# Patient Record
Sex: Female | Born: 1965 | Race: White | Hispanic: No | Marital: Married | Smoking: Never smoker
Health system: Southern US, Community
[De-identification: ages and names within clinical notes are randomized; demographics above are authoritative.]

## PROBLEM LIST (undated history)

## (undated) DIAGNOSIS — I2510 Atherosclerotic heart disease of native coronary artery without angina pectoris: Secondary | ICD-10-CM

## (undated) DIAGNOSIS — E7800 Pure hypercholesterolemia, unspecified: Secondary | ICD-10-CM

---

## 1997-12-17 ENCOUNTER — Encounter: Admission: RE | Admit: 1997-12-17 | Discharge: 1998-03-17 | Payer: Self-pay | Admitting: Obstetrics & Gynecology

## 1998-05-19 ENCOUNTER — Inpatient Hospital Stay (HOSPITAL_COMMUNITY): Admission: AD | Admit: 1998-05-19 | Discharge: 1998-05-19 | Payer: Self-pay | Admitting: Obstetrics and Gynecology

## 1998-07-01 ENCOUNTER — Inpatient Hospital Stay (HOSPITAL_COMMUNITY): Admission: AD | Admit: 1998-07-01 | Discharge: 1998-07-03 | Payer: Self-pay | Admitting: Obstetrics and Gynecology

## 1998-08-13 ENCOUNTER — Other Ambulatory Visit: Admission: RE | Admit: 1998-08-13 | Discharge: 1998-08-13 | Payer: Self-pay | Admitting: Obstetrics and Gynecology

## 1999-09-27 ENCOUNTER — Other Ambulatory Visit: Admission: RE | Admit: 1999-09-27 | Discharge: 1999-09-27 | Payer: Self-pay | Admitting: Obstetrics and Gynecology

## 2000-12-19 ENCOUNTER — Other Ambulatory Visit: Admission: RE | Admit: 2000-12-19 | Discharge: 2000-12-19 | Payer: Self-pay | Admitting: Obstetrics and Gynecology

## 2002-01-28 ENCOUNTER — Other Ambulatory Visit: Admission: RE | Admit: 2002-01-28 | Discharge: 2002-01-28 | Payer: Self-pay | Admitting: Obstetrics and Gynecology

## 2003-03-19 ENCOUNTER — Other Ambulatory Visit: Admission: RE | Admit: 2003-03-19 | Discharge: 2003-03-19 | Payer: Self-pay | Admitting: Obstetrics and Gynecology

## 2004-03-29 ENCOUNTER — Other Ambulatory Visit: Admission: RE | Admit: 2004-03-29 | Discharge: 2004-03-29 | Payer: Self-pay | Admitting: Obstetrics and Gynecology

## 2005-05-08 ENCOUNTER — Other Ambulatory Visit: Admission: RE | Admit: 2005-05-08 | Discharge: 2005-05-08 | Payer: Self-pay | Admitting: Obstetrics and Gynecology

## 2007-09-02 ENCOUNTER — Ambulatory Visit: Payer: Self-pay | Admitting: Gastroenterology

## 2007-09-02 DIAGNOSIS — K5900 Constipation, unspecified: Secondary | ICD-10-CM | POA: Insufficient documentation

## 2007-09-02 DIAGNOSIS — R159 Full incontinence of feces: Secondary | ICD-10-CM | POA: Insufficient documentation

## 2007-09-02 DIAGNOSIS — Z862 Personal history of diseases of the blood and blood-forming organs and certain disorders involving the immune mechanism: Secondary | ICD-10-CM

## 2007-09-02 LAB — CONVERTED CEMR LAB
Basophils Relative: 0 % (ref 0.0–3.0)
Eosinophils Relative: 2.4 % (ref 0.0–5.0)
HCT: 37.9 % (ref 36.0–46.0)
Hemoglobin: 13 g/dL (ref 12.0–15.0)
Lymphocytes Relative: 36.4 % (ref 12.0–46.0)
Monocytes Absolute: 0.6 10*3/uL (ref 0.1–1.0)
Monocytes Relative: 7.4 % (ref 3.0–12.0)
Neutro Abs: 4.1 10*3/uL (ref 1.4–7.7)
RBC: 4.15 M/uL (ref 3.87–5.11)

## 2007-09-19 ENCOUNTER — Ambulatory Visit: Payer: Self-pay | Admitting: Gastroenterology

## 2014-11-11 ENCOUNTER — Encounter: Payer: Self-pay | Admitting: Gastroenterology

## 2017-04-01 ENCOUNTER — Emergency Department
Admission: EM | Admit: 2017-04-01 | Discharge: 2017-04-01 | Disposition: A | Discharge status: Home / Self Care | Payer: BLUE CROSS/BLUE SHIELD | Source: Home / Self Care

## 2017-04-01 ENCOUNTER — Encounter: Payer: Self-pay | Admitting: Emergency Medicine

## 2017-04-01 DIAGNOSIS — J111 Influenza due to unidentified influenza virus with other respiratory manifestations: Secondary | ICD-10-CM | POA: Diagnosis not present

## 2017-04-01 HISTORY — DX: Pure hypercholesterolemia, unspecified: E78.00

## 2017-04-01 HISTORY — DX: Atherosclerotic heart disease of native coronary artery without angina pectoris: I25.10

## 2017-04-01 LAB — POCT INFLUENZA A/B
INFLUENZA A, POC: POSITIVE — AB
INFLUENZA B, POC: NEGATIVE

## 2017-04-01 MED ORDER — BENZONATATE 100 MG PO CAPS: 100.0000 mg | ORAL_CAPSULE | Freq: Three times a day (TID) | ORAL | 0 refills | Status: AC

## 2017-04-01 MED ORDER — OSELTAMIVIR PHOSPHATE 75 MG PO CAPS: 75.0000 mg | ORAL_CAPSULE | Freq: Two times a day (BID) | ORAL | 0 refills | Status: AC

## 2017-04-01 NOTE — ED Triage Notes (Signed)
[facility] wit C/O Flu like symptoms times 2 days fever 102.3 last PM

## 2017-04-01 NOTE — Discharge Instructions (Signed)
Return if any problems.

## 2017-04-03 NOTE — ED Provider Notes (Signed)
[facility]    CSN: [id number] Arrival date & time: 04/01/17  1455     History   Chief Complaint Chief Complaint  Patient presents with  . Influenza    HPI Bailey Watts is a 52 y.o. female.   The history is provided by the patient. No language interpreter was used.  Influenza  Presenting symptoms: cough, fever and myalgias   Severity:  Moderate Onset quality:  Gradual Duration:  2 days Progression:  Worsening Chronicity:  New Relieved by:  Nothing Worsened by:  Nothing Ineffective treatments:  None tried Associated symptoms: no chills   Risk factors: no diabetes problem     Past Medical History:  Diagnosis Date  . Coronary artery disease   . High cholesterol     Patient Active Problem List   Diagnosis Date Noted  . CONSTIPATION 09/02/2007  . FECAL INCONTINENCE 09/02/2007  . ANEMIA, IRON DEFICIENCY, HX OF 09/02/2007    History reviewed. No pertinent surgical history.  OB History    No data available       Home Medications    Prior to Admission medications   Medication Sig Start Date End Date Taking? Authorizing Provider  aspirin EC 81 MG tablet Take 81 mg by mouth daily.   Yes [provider]  FLUoxetine (PROZAC) 20 MG/5ML solution Take by mouth daily.   Yes [provider]  loratadine (CLARITIN) 10 MG tablet Take 10 mg by mouth daily.   Yes [provider]  benzonatate (TESSALON) 100 MG capsule Take 1 capsule (100 mg total) by mouth every 8 (eight) hours. 04/01/17   Elson Areas, PA-C  oseltamivir (TAMIFLU) 75 MG capsule Take 1 capsule (75 mg total) by mouth every 12 (twelve) hours. 04/01/17   Elson Areas, PA-C    Family History History reviewed. No pertinent family history.  Social History Social History   Tobacco Use  . Smoking status: Never Smoker  . Smokeless tobacco: Never Used  Substance Use Topics  . Alcohol use: No    Frequency: Never  . Drug use: No     Allergies   Patient has  no allergy information on record.   Review of Systems Review of Systems  Constitutional: Positive for fever. Negative for chills.  Respiratory: Positive for cough.   Musculoskeletal: Positive for myalgias.  All other systems reviewed and are negative.    Physical Exam Triage Vital Signs ED Triage Vitals  Enc Vitals Group     BP 04/01/17 1548 117/76     Pulse Rate 04/01/17 1548 (!) 114     Resp 04/01/17 1548 16     Temp 04/01/17 1548 100.2 F (37.9 C)     Temp Source 04/01/17 1548 Oral     SpO2 04/01/17 1548 97 %     Weight 04/01/17 1550 148 lb 8 oz (67.4 kg)     Height 04/01/17 1550 5\' 5"  (1.651 m)     Head Circumference --      Peak Flow --      Pain Score 04/01/17 1549 3     Pain Loc --      Pain Edu? --      Excl. in GC? --    No data found.  Updated Vital Signs BP 117/76 (BP Location: Right Arm)   Pulse (!) 114   Temp 100.2 F (37.9 C) (Oral)   Resp 16   Ht 5\' 5"  (1.651 m)   Wt 148 lb 8 oz (67.4 kg)  SpO2 97%   BMI 24.71 kg/m   Visual Acuity Right Eye Distance:   Left Eye Distance:   Bilateral Distance:    Right Eye Near:   Left Eye Near:    Bilateral Near:     Physical Exam  Constitutional: She appears well-developed and well-nourished. No distress.  HENT:  Head: Normocephalic and atraumatic.  Right Ear: External ear normal.  Left Ear: External ear normal.  Nose: Nose normal.  Mouth/Throat: Oropharynx is clear and moist.  Eyes: Conjunctivae are normal.  Neck: Neck supple.  Cardiovascular: Normal rate and regular rhythm.  No murmur heard. Pulmonary/Chest: Effort normal and breath sounds normal. No respiratory distress.  Abdominal: Soft. There is no tenderness.  Musculoskeletal: She exhibits no edema.  Neurological: She is alert.  Skin: Skin is warm and dry.  Psychiatric: She has a normal mood and affect.  Nursing note and vitals reviewed.    UC Treatments / Results  Labs (all labs ordered are listed, but only abnormal results are  displayed) Labs Reviewed  POCT INFLUENZA A/B - Abnormal; Notable for the following components:      Result Value   Influenza A, POC Positive (*)    All other components within normal limits    EKG  EKG Interpretation None       Radiology No results found.  Procedures Procedures (including critical care time)  Medications Ordered in UC Medications - No data to display   Initial Impression / Assessment and Plan / UC Course  I have reviewed the triage vital signs and the nursing notes.  Pertinent labs & imaging results that were available during my care of the patient were reviewed by me and considered in my medical decision making (see chart for details).     MDM:  Pt counseled on influenza.  Pt given rx for tamiflu and tessalon.    Final Clinical Impressions(s) / UC Diagnoses   Final diagnoses:  Influenza    ED Discharge Orders        Ordered    oseltamivir (TAMIFLU) 75 MG capsule  Every 12 hours     04/01/17 1613    benzonatate (TESSALON) 100 MG capsule  Every 8 hours     04/01/17 1615     An After Visit Summary was printed and given to the patient.   Controlled Substance Prescriptions [place] Controlled Substance Registry consulted? Not Applicable   Elson AreasSofia, Tahnee Cifuentes K, [place]-C 04/03/17 1724

## 2017-04-15 ENCOUNTER — Encounter: Payer: Self-pay | Admitting: Emergency Medicine

## 2017-04-15 ENCOUNTER — Emergency Department (INDEPENDENT_AMBULATORY_CARE_PROVIDER_SITE_OTHER): Payer: BLUE CROSS/BLUE SHIELD

## 2017-04-15 ENCOUNTER — Emergency Department
Admission: EM | Admit: 2017-04-15 | Discharge: 2017-04-15 | Disposition: A | Discharge status: Home / Self Care | Payer: BLUE CROSS/BLUE SHIELD | Source: Home / Self Care | Attending: Emergency Medicine | Admitting: Emergency Medicine

## 2017-04-15 DIAGNOSIS — R0781 Pleurodynia: Secondary | ICD-10-CM

## 2017-04-15 DIAGNOSIS — R0789 Other chest pain: Secondary | ICD-10-CM | POA: Diagnosis not present

## 2017-04-15 DIAGNOSIS — R52 Pain, unspecified: Secondary | ICD-10-CM

## 2017-04-15 NOTE — ED Provider Notes (Signed)
[facility]    CSN: [id number] Arrival date & time: 04/15/17  1136     History   Chief Complaint Chief Complaint  Patient presents with  . Abdominal Pain    HPI Bailey Watts is a 52 y.o. female.   HPI Patient was seen 2 weeks ago and diagnosed with flu this gradually improved except for a lingering persistent cough. She enters today wth a four-day history of right-sided chest pain which she says is stabbing in nature and primarily located in the upper abdomen. She has had no GI symptoms of nausea or vomiting. She has a family history of gallbladder disease. Past Medical History:  Diagnosis Date  . Coronary artery disease   . High cholesterol     Patient Active Problem List   Diagnosis Date Noted  . CONSTIPATION 09/02/2007  . FECAL INCONTINENCE 09/02/2007  . ANEMIA, IRON DEFICIENCY, HX OF 09/02/2007    History reviewed. No pertinent surgical history.  OB History    No data available       Home Medications    Prior to Admission medications   Medication Sig Start Date End Date Taking? Authorizing Provider  aspirin EC 81 MG tablet Take 81 mg by mouth daily.    [provider]  benzonatate (TESSALON) 100 MG capsule Take 1 capsule (100 mg total) by mouth every 8 (eight) hours. 04/01/17   Elson Areas, PA-C  FLUoxetine (PROZAC) 20 MG/5ML solution Take by mouth daily.    [provider]  loratadine (CLARITIN) 10 MG tablet Take 10 mg by mouth daily.    [provider]  oseltamivir (TAMIFLU) 75 MG capsule Take 1 capsule (75 mg total) by mouth every 12 (twelve) hours. 04/01/17   Elson Areas, PA-C    Family History History reviewed. No pertinent family history.  Social History Social History   Tobacco Use  . Smoking status: Never Smoker  . Smokeless tobacco: Never Used  Substance Use Topics  . Alcohol use: No    Frequency: Never  . Drug use: No     Allergies   Patient has no allergy information on  record.   Review of Systems Review of Systems  Constitutional: Negative for chills and fever.  HENT: Negative.   Eyes: Negative.   Respiratory:       She is not short of breath but does have pain with taking a deep breath.  Cardiovascular: Positive for chest pain.  Gastrointestinal: Positive for abdominal pain. Negative for nausea.  Endocrine: Negative.   Genitourinary: Negative.      Physical Exam Triage Vital Signs ED Triage Vitals  Enc Vitals Group     BP 04/15/17 1224 111/74     Pulse Rate 04/15/17 1224 76     Resp 04/15/17 1224 16     Temp 04/15/17 1224 97.9 F (36.6 C)     Temp Source 04/15/17 1224 Oral     SpO2 04/15/17 1224 98 %     Weight 04/15/17 1225 150 lb 12 oz (68.4 kg)     Height 04/15/17 1225 5\' 7"  (1.702 m)     Head Circumference --      Peak Flow --      Pain Score 04/15/17 1225 4     Pain Loc --      Pain Edu? --      Excl. in GC? --    No data found.  Updated Vital Signs BP 111/74 (BP Location: Right Arm)   Pulse 76  Temp 97.9 F (36.6 C) (Oral)   Resp 16   Ht 5\' 7"  (1.702 m)   Wt 150 lb 12 oz (68.4 kg)   SpO2 98%   BMI 23.61 kg/m   Visual Acuity Right Eye Distance:   Left Eye Distance:   Bilateral Distance:    Right Eye Near:   Left Eye Near:    Bilateral Near:     Physical Exam   UC Treatments / Results  Labs (all labs ordered are listed, but only abnormal results are displayed) Labs Reviewed - No data to display  EKG  EKG Interpretation None       Radiology Dg Chest 2 View  Result Date: 04/15/2017 CLINICAL DATA:  Right pleuritic chest pain EXAM: CHEST - 2 VIEW COMPARISON:  Right rib series performed today FINDINGS: Heart and mediastinal contours are within normal limits. No focal opacities or effusions. No acute bony abnormality. No pneumothorax. IMPRESSION: No active cardiopulmonary disease. Electronically Signed   By: Charlett NoseKevin  Dover M.D.   On: 04/15/2017 13:36   Dg Ribs Unilateral Right  Result Date:  04/15/2017 CLINICAL DATA:  Right rib pain EXAM: RIGHT RIBS - 2 VIEW COMPARISON:  Chest x-ray performed today FINDINGS: No fracture or other bone lesions are seen involving the ribs. IMPRESSION: Negative. Electronically Signed   By: Charlett NoseKevin  Dover M.D.   On: 04/15/2017 13:36    Procedures Procedures (including critical care time)  Medications Ordered in UC Medications - No data to display   Initial Impression / Assessment and Plan / UC Course  I have reviewed the triage vital signs and the nursing notes.  Pertinent labs & imaging results that were available during my care of the patient were reviewed by me and considered in my medical decision making (see chart for details). Chest x-ray and right rib films are unremarkable. Exam is consistent with chest wall pain. She will be on ibuprofen. She will also watch for development of a rash in the area.      Final Clinical Impressions(s) / UC Diagnoses   Final diagnoses:  Chest wall pain    ED Discharge Orders    None     Take ibuprofen 2 every 4-6 hours.  Be sure to take this with food.Please return to clinic with any worsening of symptoms.please watch for a rash and call if it is suspicious for shingles.  Controlled Substance Prescriptions [place] Controlled Substance Registry consulted? Not Applicable   Collene Gobbleaub, Yasin Ducat A, MD 04/15/17 [phone]

## 2017-04-15 NOTE — ED Triage Notes (Signed)
[facility]/O pain below the right breast area times 4 days. Sharp in nature and intermittent pain worse over the last two days. Patient had confirmed flu 2 weeks ago.

## 2017-04-15 NOTE — Discharge Instructions (Signed)
Take ibuprofen 2 every 4-6 hours.  Be sure to take this with food.Please return to clinic with any worsening of symptoms. Please call if you develop a rash.

## 2017-04-18 ENCOUNTER — Telehealth: Payer: Self-pay | Admitting: Emergency Medicine

## 2017-04-18 NOTE — Telephone Encounter (Signed)
Inquired about patient's status; encourage them to call with questions/concerns.  

## 2017-09-13 ENCOUNTER — Encounter: Payer: Self-pay | Admitting: Gastroenterology

## 2018-08-19 IMAGING — DX DG RIBS 2V*R*
2 series · 2 of 2 positions shown · non-contrast
Comparison: Chest x-ray performed today

CLINICAL DATA: Right rib pain

EXAM:
RIGHT RIBS - 2 VIEW

[rib ap]
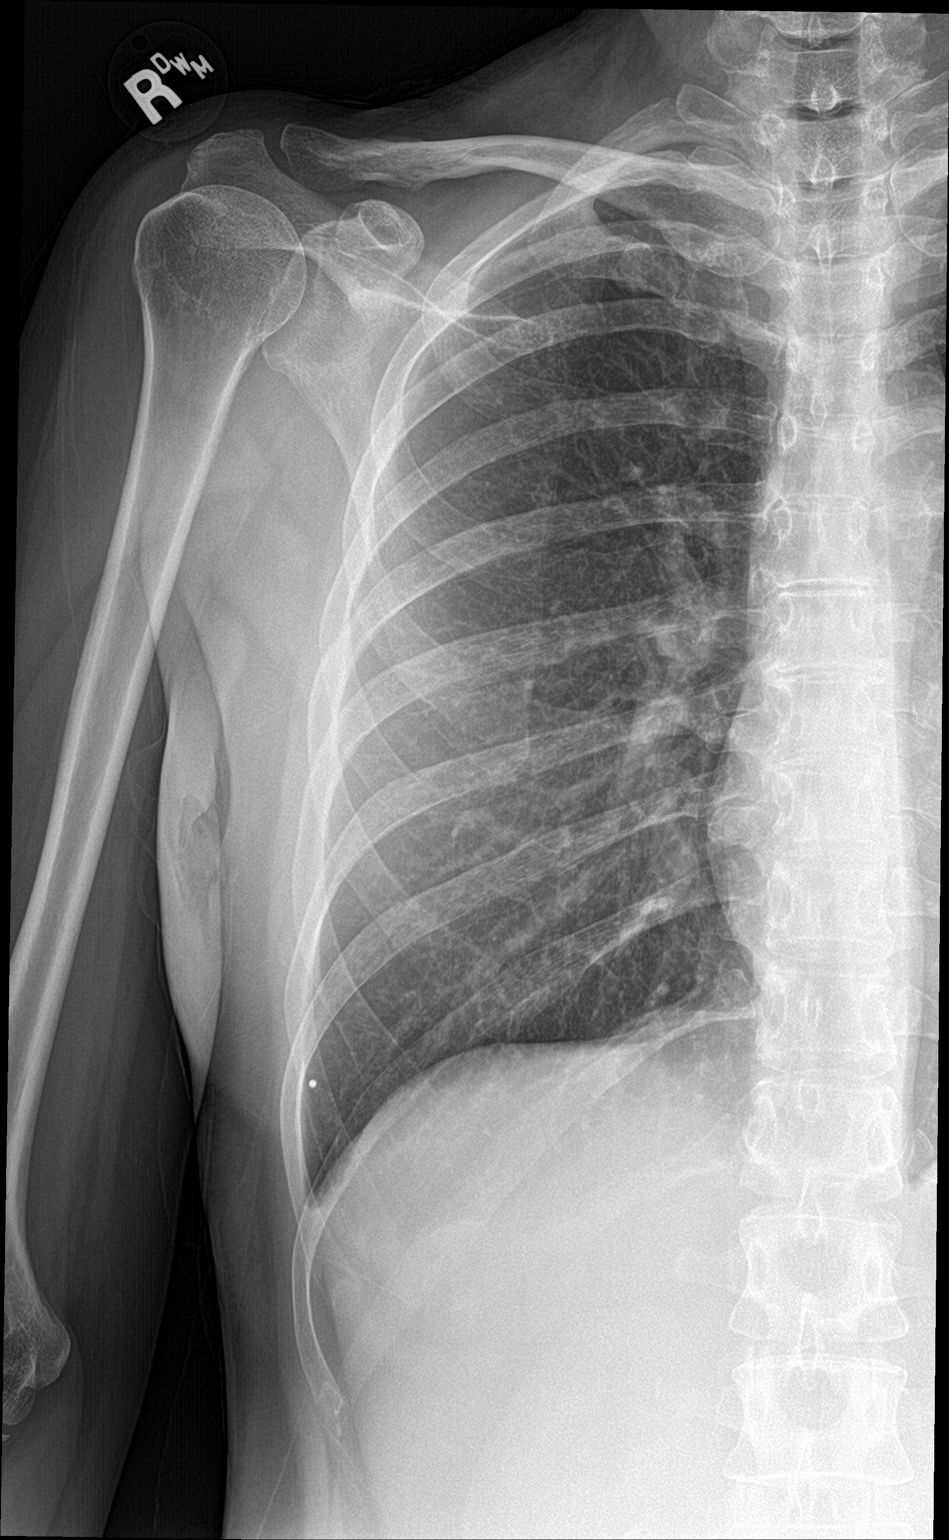

[rib ap obl]
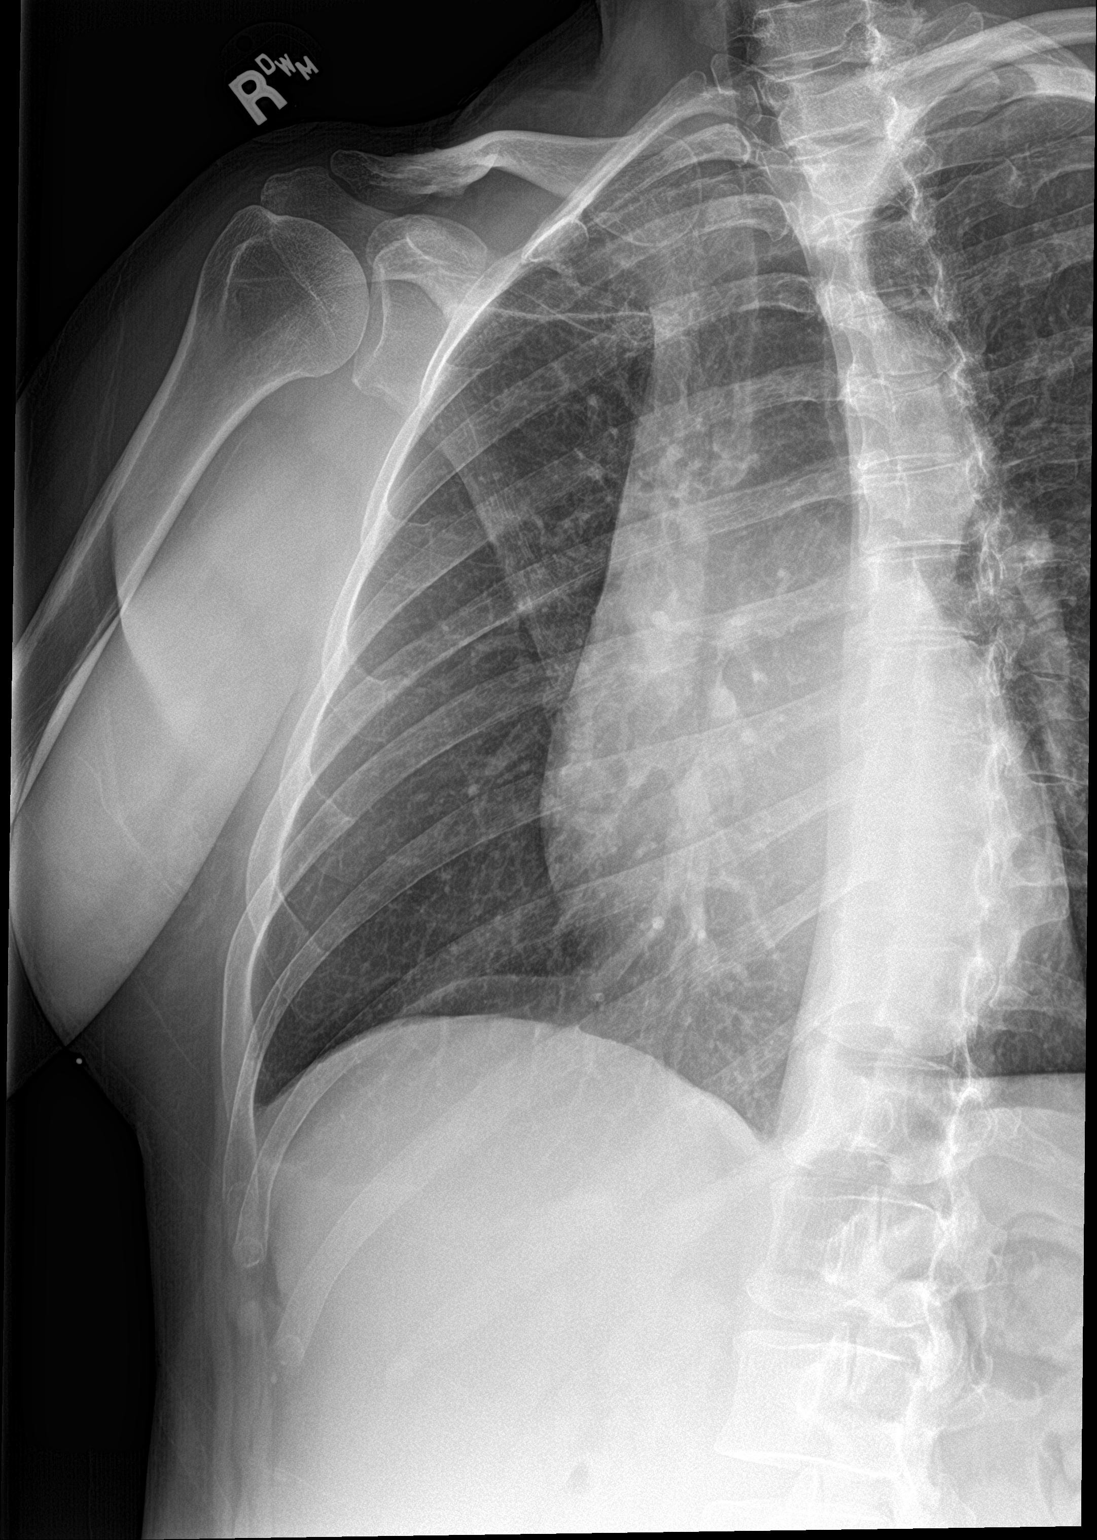

[2 of 2 positions shown; findings below may reference images not displayed]

FINDINGS: No fracture or other bone lesions are seen involving the ribs.
IMPRESSION: Negative.
